# Patient Record
Sex: Male | Born: 1955 | Race: White | Hispanic: No | Marital: Married | Smoking: Never smoker
Health system: Southern US, Community
[De-identification: ages and names within clinical notes are randomized; demographics above are authoritative.]

## PROBLEM LIST (undated history)

## (undated) DIAGNOSIS — I509 Heart failure, unspecified: Secondary | ICD-10-CM

## (undated) HISTORY — PX: COLONOSCOPY: SHX174

## (undated) HISTORY — PX: SKIN TAG REMOVAL: SHX780

---

## 2014-03-16 DIAGNOSIS — IMO0002 Reserved for concepts with insufficient information to code with codable children: Secondary | ICD-10-CM | POA: Insufficient documentation

## 2014-03-16 DIAGNOSIS — R937 Abnormal findings on diagnostic imaging of other parts of musculoskeletal system: Secondary | ICD-10-CM | POA: Insufficient documentation

## 2014-04-09 DIAGNOSIS — S93609A Unspecified sprain of unspecified foot, initial encounter: Secondary | ICD-10-CM | POA: Insufficient documentation

## 2014-12-27 ENCOUNTER — Ambulatory Visit
Admission: EM | Admit: 2014-12-27 | Discharge: 2014-12-27 | Disposition: A | Discharge status: Home / Self Care | Payer: BLUE CROSS/BLUE SHIELD | Attending: Family Medicine | Admitting: Family Medicine

## 2014-12-27 ENCOUNTER — Encounter: Payer: Self-pay | Admitting: Emergency Medicine

## 2014-12-27 ENCOUNTER — Ambulatory Visit: Payer: BLUE CROSS/BLUE SHIELD

## 2014-12-27 DIAGNOSIS — R0989 Other specified symptoms and signs involving the circulatory and respiratory systems: Secondary | ICD-10-CM | POA: Diagnosis present

## 2014-12-27 DIAGNOSIS — R062 Wheezing: Secondary | ICD-10-CM | POA: Insufficient documentation

## 2014-12-27 DIAGNOSIS — R05 Cough: Secondary | ICD-10-CM | POA: Diagnosis not present

## 2014-12-27 HISTORY — DX: Heart failure, unspecified: I50.9

## 2014-12-27 LAB — CBC WITH DIFFERENTIAL/PLATELET
Basophils Absolute: 0.1 10*3/uL (ref 0–0.1)
Basophils Relative: 1 %
EOS PCT: 3 %
Eosinophils Absolute: 0.3 10*3/uL (ref 0–0.7)
HEMATOCRIT: 42.6 % (ref 40.0–52.0)
HEMOGLOBIN: 14.7 g/dL (ref 13.0–18.0)
LYMPHS ABS: 2.1 10*3/uL (ref 1.0–3.6)
LYMPHS PCT: 25 %
MCH: 31.3 pg (ref 26.0–34.0)
MCHC: 34.5 g/dL (ref 32.0–36.0)
MCV: 90.9 fL (ref 80.0–100.0)
Monocytes Absolute: 0.6 10*3/uL (ref 0.2–1.0)
Monocytes Relative: 7 %
Neutro Abs: 5.4 10*3/uL (ref 1.4–6.5)
Neutrophils Relative %: 64 %
PLATELETS: 189 10*3/uL (ref 150–440)
RBC: 4.68 MIL/uL (ref 4.40–5.90)
RDW: 13.2 % (ref 11.5–14.5)
WBC: 8.4 10*3/uL (ref 3.8–10.6)

## 2014-12-27 LAB — BASIC METABOLIC PANEL
ANION GAP: 5 (ref 5–15)
BUN: 20 mg/dL (ref 6–20)
CO2: 32 mmol/L (ref 22–32)
CREATININE: 0.65 mg/dL (ref 0.61–1.24)
Calcium: 9.4 mg/dL (ref 8.9–10.3)
Chloride: 102 mmol/L (ref 101–111)
GFR calc non Af Amer: >60 mL/min (ref >60)
Glucose, Bld: 110 mg/dL — ABNORMAL HIGH (ref 65–99)
POTASSIUM: 3.9 mmol/L (ref 3.5–5.1)
Sodium: 139 mmol/L (ref 135–145)

## 2014-12-27 LAB — BRAIN NATRIURETIC PEPTIDE: B Natriuretic Peptide: 15 pg/mL (ref 0.0–100.0)

## 2014-12-27 MED ORDER — ALBUTEROL SULFATE HFA 108 (90 BASE) MCG/ACT IN AERS: 2.0000 | INHALATION_SPRAY | Freq: Four times a day (QID) | RESPIRATORY_TRACT | Status: DC | PRN

## 2014-12-27 NOTE — ED Provider Notes (Signed)
Patient presents today with history of chest congestion, wheezing. Patient states that he chronically lives with the symptoms but recently has gotten worse. He has used other people's albuterol inhaler in the past with good relief. He states that at one time he was diagnosed with congestive heart failure and then later told that he did not have it. He states that he has had to sleep on an incline recently to help with his breathing. 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 Patient denies any chest pain or increased shortness of breath in the office today. He states that he has been coughing up some white phlegm. He denies any fever, weight loss, headache, nausea, vomiting, dizziness. Patient states that he has had a full cardiac workup done last year with no significant findings. He denies any history of smoking or excessive alcohol use. He does not take any medication on a daily basis.   Review of systems negative except mentioned above. Vitals as per chart. General: NAD, obese HEENT: no pharyngeal erythema, no exudate, no erythema of TMs, no cervical LAD Resp: slightly decreased breath sounds at bases, mild expiratory wheezing, no accessory muscle use, speaks in sentences without problems  Cards: RRR Extremities:1+edema bilateral LEs Neuro: CN II-XII grossly intact   A/P: Wheezing, Cough-chest x-ray shows no acute findings, given his symptoms some lab work was ordered including a BNP, encourage patient to follow up with a primary care provider in the next few days for further workup and treatment, patient Mathies need to follow-up with a pulmonologist and/or cardiologist, I have asked the patient to obtain records from [place] regarding his cardiac workup done last year, prior to his visit with the primary care provider. Albuterol MDI was prescribed given symptomatic relief in the  past. I have asked the patient to seek immediate medical attention if his symptoms change or worsen. Patient appreciative.  Paulina Fusi, MD 12/27/14 1630

## 2014-12-27 NOTE — Discharge Instructions (Signed)
As inhaler has worked in the past, try this. If symptoms worsen or change as discussed seek immediate medical attention.

## 2014-12-27 NOTE — ED Notes (Signed)
Patient states he is wheezing and feels like he can't get a deep breath

## 2014-12-27 NOTE — ED Notes (Addendum)
Scheduled an appointment for patient upstairs Tomorrow June 16 at 2pm with Dr. Vicente Masson for followup on labs.

## 2014-12-28 ENCOUNTER — Ambulatory Visit (INDEPENDENT_AMBULATORY_CARE_PROVIDER_SITE_OTHER): Payer: BLUE CROSS/BLUE SHIELD | Admitting: Family Medicine

## 2014-12-28 ENCOUNTER — Encounter: Payer: Self-pay | Admitting: Family Medicine

## 2014-12-28 VITALS — BP 130/98 | HR 100 | Ht 70.0 in | Wt 323.4 lb

## 2014-12-28 DIAGNOSIS — R5382 Chronic fatigue, unspecified: Secondary | ICD-10-CM | POA: Diagnosis not present

## 2014-12-28 DIAGNOSIS — Z8719 Personal history of other diseases of the digestive system: Secondary | ICD-10-CM | POA: Diagnosis not present

## 2014-12-28 DIAGNOSIS — G4733 Obstructive sleep apnea (adult) (pediatric): Secondary | ICD-10-CM | POA: Diagnosis not present

## 2014-12-28 DIAGNOSIS — R739 Hyperglycemia, unspecified: Secondary | ICD-10-CM

## 2014-12-28 DIAGNOSIS — J9801 Acute bronchospasm: Secondary | ICD-10-CM | POA: Insufficient documentation

## 2014-12-28 DIAGNOSIS — G4730 Sleep apnea, unspecified: Secondary | ICD-10-CM

## 2014-12-28 DIAGNOSIS — E669 Obesity, unspecified: Secondary | ICD-10-CM
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
## 2014-12-29 LAB — HEMOGLOBIN A1C
Est. average glucose Bld gHb Est-mCnc: 105 mg/dL
Hgb A1c MFr Bld: 5.3 % (ref 4.8–5.6)

## 2014-12-29 LAB — TSH: TSH: 3.86 u[IU]/mL (ref 0.450–4.500)

## 2014-12-29 LAB — VITAMIN D 25 HYDROXY (VIT D DEFICIENCY, FRACTURES): Vit D, 25-Hydroxy: 25.8 ng/mL — ABNORMAL LOW (ref 30.0–100.0)

## 2014-12-29 LAB — VITAMIN B12: VITAMIN B 12: 551 pg/mL (ref 211–946)

## 2015-01-05 ENCOUNTER — Other Ambulatory Visit: Payer: Self-pay

## 2015-01-05 ENCOUNTER — Telehealth: Payer: Self-pay
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
## 2015-01-25 ENCOUNTER — Ambulatory Visit: Payer: BLUE CROSS/BLUE SHIELD | Admitting: Family Medicine

## 2015-03-06 ENCOUNTER — Ambulatory Visit: Admit: 2015-03-06 | Payer: BLUE CROSS/BLUE SHIELD | Admitting: Gastroenterology

## 2015-03-06 SURGERY — COLONOSCOPY WITH PROPOFOL
Anesthesia: General

## 2015-05-10 DIAGNOSIS — S0990XA Unspecified injury of head, initial encounter: Secondary | ICD-10-CM | POA: Insufficient documentation

## 2015-05-31 ENCOUNTER — Ambulatory Visit (INDEPENDENT_AMBULATORY_CARE_PROVIDER_SITE_OTHER): Payer: BLUE CROSS/BLUE SHIELD

## 2015-05-31 ENCOUNTER — Ambulatory Visit
Admission: EM | Admit: 2015-05-31 | Discharge: 2015-05-31 | Disposition: A | Discharge status: Home / Self Care | Payer: BLUE CROSS/BLUE SHIELD | Attending: Family Medicine | Admitting: Family Medicine

## 2015-05-31 ENCOUNTER — Encounter: Payer: Self-pay | Admitting: *Deleted

## 2015-05-31 DIAGNOSIS — W19XXXA Unspecified fall, initial encounter: Secondary | ICD-10-CM | POA: Diagnosis not present

## 2015-05-31 DIAGNOSIS — M25561 Pain in right knee: Secondary | ICD-10-CM | POA: Diagnosis not present

## 2015-05-31 NOTE — ED Notes (Signed)
Pt states that he lost his balance and landed on his right knee.

## 2015-05-31 NOTE — Discharge Instructions (Signed)
Use anti-inflammatories (ibuprofen or aleve) as needed.  Rest, Ice, Elevation, Compression.  If you fail to improve you will need ortho referral.  Take care  Dr. Lacinda Axon

## 2015-05-31 NOTE — ED Provider Notes (Signed)
CSN: [id number]     Arrival date & time 05/31/15  1742 History   First MD Initiated Contact with Patient 05/31/15 1801     Chief Complaint  Patient presents with  . Knee Injury   (Consider location/radiation/quality/duration/timing/severity/associated sxs/prior Treatment) HPI 59 year old male presents with complaints of right knee pain after suffering a fall earlier today.  Patient reports that he was looking down and approached uneven ground and had a misstep. He subsequently went to the knee and injured his right knee on "rock hard ground". He states that this happened approximately 4 hours ago. He reports immediate pain and swelling as well as warmth and mild redness. He reports decreased range of motion. Pain is currently 7 out of 10 in severity. He has not taken any medications or tried any interventions. He states that this feels similar to when he fractured his left knee years ago.  Past Medical History  Diagnosis Date  . CHF (congestive heart failure) ([place])    History reviewed. No pertinent past surgical history.   Family History  Problem Relation Age of Onset  . Diabetes Father   . Hearing loss Father   . Heart disease Mother    Social History  Substance Use Topics  . Smoking status: Never Smoker   . Smokeless tobacco: Never Used  . Alcohol Use: 1.8 oz/week    0 Standard drinks or equivalent, 1 Glasses of wine, 1 Cans of beer, 1 Shots of liquor per week     Comment: occasionaly    Review of Systems  Constitutional: Negative.   Respiratory: Negative.   Cardiovascular: Negative.   Musculoskeletal:       Right knee pain.   Allergies  Review of patient's allergies indicates no known allergies.  Home Medications   Prior to Admission medications   Medication Sig Start Date End Date Taking? Authorizing Provider  albuterol (PROVENTIL HFA;VENTOLIN HFA) 108 (90 BASE) MCG/ACT inhaler Inhale 2 puffs into the lungs every 6 (six) hours as needed for wheezing or shortness  of breath. 12/27/14   Paulina Fusi, MD  Multiple Vitamin (MULTI-VITAMINS) TABS Take 1 tablet by mouth.    Historical Provider, MD  Omega-3 Fatty Acids (FISH OIL) 1000 MG CAPS Take 1 capsule by mouth.    Historical Provider, MD   Meds Ordered and Administered this Visit  Medications - No data to display  BP 120/81 mmHg  Pulse 88  Temp(Src) 98.7 F (37.1 C) (Tympanic)  Ht 5\' 10"  (1.778 m)  Wt 335 lb (151.955 kg)  BMI 48.07 kg/m2  SpO2 98% No data found.  Physical Exam  Constitutional: He appears well-developed. No distress.  Cardiovascular: Normal rate and regular rhythm.   Pulmonary/Chest: Effort normal.  Coarse breath sounds throughout.  Musculoskeletal:  Right knee -  Inspection - mild anterior erythema/warmth. Palpation - mild lateral joint line tenderness. Patient also has tenderness over the patella. ROM decrease in extension. Ligaments with solid consistent endpoints including ACL, PCL, LCL, MCL.    Neurological: He is alert.  Psychiatric: He has a normal mood and affect.  Vitals reviewed.   ED Course  Procedures (including critical care time)  Labs Review Labs Reviewed - No data to display  Imaging Review Dg Knee Complete 4 Views Right  05/31/2015  CLINICAL DATA:  Fall today with anterior right knee pain, initial encounter EXAM: RIGHT KNEE - COMPLETE 4+ VIEW COMPARISON:  None. FINDINGS: There is no evidence of fracture, dislocation, or joint effusion. There is no evidence of arthropathy or  other focal bone abnormality. Soft tissues are unremarkable. IMPRESSION: No acute abnormality noted. Electronically Signed   By: Inez Catalina M.D.   On: 05/31/2015 18:40    MDM   1. Right knee pain   2. Fall, initial encounter    59 year old obese male presents following a fall. Patient complaint of right knee pain. Exam remarkable for lateral joint line tenderness. X-ray reviewed personally and I agree with the radiology read. No acute abnormality. Could be a meniscal  injury. Treating conservatively with RICE and anti-inflammatories. I encouraged him to follow-up with his PCP if he fails to improve or worsens as he [place] need MRI/orthopedic eval.   Coral Spikes, DO 05/31/15 1856

## 2015-06-20 ENCOUNTER — Encounter: Payer: Self-pay | Admitting: Family Medicine

## 2015-06-20 ENCOUNTER — Ambulatory Visit (INDEPENDENT_AMBULATORY_CARE_PROVIDER_SITE_OTHER): Payer: BLUE CROSS/BLUE SHIELD | Admitting: Family Medicine

## 2015-06-20 VITALS — BP 138/82 | HR 68 | Ht 70.0 in | Wt 339.0 lb

## 2015-06-20 DIAGNOSIS — N529 Male erectile dysfunction, unspecified: Secondary | ICD-10-CM | POA: Diagnosis not present

## 2015-06-20 DIAGNOSIS — G4733 Obstructive sleep apnea (adult) (pediatric): Secondary | ICD-10-CM

## 2015-06-20 DIAGNOSIS — J9801 Acute bronchospasm: Secondary | ICD-10-CM

## 2015-06-20 DIAGNOSIS — R5382 Chronic fatigue, unspecified: Secondary | ICD-10-CM | POA: Diagnosis not present

## 2015-06-20 DIAGNOSIS — E559 Vitamin D deficiency, unspecified: Secondary | ICD-10-CM

## 2015-06-20 DIAGNOSIS — D239 Other benign neoplasm of skin, unspecified: Secondary | ICD-10-CM

## 2015-06-20 DIAGNOSIS — N471 Phimosis: Secondary | ICD-10-CM | POA: Diagnosis not present

## 2015-06-20 DIAGNOSIS — G4730 Sleep apnea, unspecified: Secondary | ICD-10-CM

## 2015-06-20 DIAGNOSIS — Z23 Encounter for immunization: Secondary | ICD-10-CM

## 2015-06-20 DIAGNOSIS — E291 Testicular hypofunction: Secondary | ICD-10-CM | POA: Diagnosis not present

## 2015-06-20 DIAGNOSIS — D229 Melanocytic nevi, unspecified: Secondary | ICD-10-CM | POA: Insufficient documentation

## 2015-06-20 DIAGNOSIS — E349 Endocrine disorder, unspecified: Secondary | ICD-10-CM

## 2015-06-20 MED ORDER — BETAMETHASONE DIPROPIONATE AUG 0.05 % EX CREA: TOPICAL_CREAM | Freq: Two times a day (BID) | CUTANEOUS | Status: DC

## 2015-06-20 MED ORDER — ALBUTEROL SULFATE HFA 108 (90 BASE) MCG/ACT IN AERS
2.0000 | INHALATION_SPRAY | Freq: Four times a day (QID) | RESPIRATORY_TRACT | Status: AC | PRN
Start: 2015-06-20 — End: ?
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
## 2015-06-21 DIAGNOSIS — E349 Endocrine disorder, unspecified: Secondary | ICD-10-CM | POA: Insufficient documentation

## 2015-06-21 LAB — VITAMIN D 25 HYDROXY (VIT D DEFICIENCY, FRACTURES): Vit D, 25-Hydroxy: 24.2 ng/mL — ABNORMAL LOW (ref 30.0–100.0)

## 2015-06-22 LAB — TESTOSTERONE,FREE AND TOTAL: Testosterone, Free: 5.2 pg/mL — ABNORMAL LOW (ref 7.2–24.0)

## 2015-06-27 ENCOUNTER — Ambulatory Visit (INDEPENDENT_AMBULATORY_CARE_PROVIDER_SITE_OTHER): Payer: BLUE CROSS/BLUE SHIELD | Admitting: Family Medicine

## 2015-06-27 ENCOUNTER — Encounter: Payer: Self-pay | Admitting: Family Medicine

## 2015-06-27 VITALS — BP 124/80 | HR 64 | Ht 70.0 in | Wt 335.0 lb

## 2015-06-27 DIAGNOSIS — E291 Testicular hypofunction: Secondary | ICD-10-CM | POA: Diagnosis not present

## 2015-06-27 DIAGNOSIS — N471 Phimosis: Secondary | ICD-10-CM

## 2015-06-27 DIAGNOSIS — E559 Vitamin D deficiency, unspecified: Secondary | ICD-10-CM | POA: Diagnosis not present

## 2015-06-27 DIAGNOSIS — G4733 Obstructive sleep apnea (adult) (pediatric): Secondary | ICD-10-CM

## 2015-06-27 DIAGNOSIS — N529 Male erectile dysfunction, unspecified: Secondary | ICD-10-CM | POA: Diagnosis not present

## 2015-06-27 DIAGNOSIS — E349 Endocrine disorder, unspecified: Secondary | ICD-10-CM

## 2015-06-27 DIAGNOSIS — G4730 Sleep apnea, unspecified: Secondary | ICD-10-CM
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
## 2015-07-19 ENCOUNTER — Ambulatory Visit: Payer: BLUE CROSS/BLUE SHIELD | Attending: Specialist

## 2015-07-19 DIAGNOSIS — I509 Heart failure, unspecified: Secondary | ICD-10-CM | POA: Insufficient documentation

## 2015-07-19 DIAGNOSIS — Z77090 Contact with and (suspected) exposure to asbestos: Secondary | ICD-10-CM | POA: Diagnosis not present

## 2015-07-19 DIAGNOSIS — E669 Obesity, unspecified: Secondary | ICD-10-CM | POA: Diagnosis present

## 2015-07-19 DIAGNOSIS — R0683 Snoring: Secondary | ICD-10-CM | POA: Diagnosis present

## 2015-07-19 DIAGNOSIS — R05 Cough: Secondary | ICD-10-CM | POA: Insufficient documentation

## 2015-07-19 DIAGNOSIS — F1010 Alcohol abuse, uncomplicated: Secondary | ICD-10-CM | POA: Diagnosis not present

## 2015-07-19 DIAGNOSIS — G4730 Sleep apnea, unspecified: Secondary | ICD-10-CM | POA: Insufficient documentation

## 2015-07-26 ENCOUNTER — Encounter: Payer: Self-pay | Admitting: Family Medicine

## 2015-07-26 ENCOUNTER — Ambulatory Visit (INDEPENDENT_AMBULATORY_CARE_PROVIDER_SITE_OTHER): Payer: BLUE CROSS/BLUE SHIELD | Admitting: Family Medicine

## 2015-07-26 VITALS — BP 136/84 | HR 71 | Temp 98.3°F | Resp 16 | Ht 70.0 in | Wt 347.0 lb

## 2015-07-26 DIAGNOSIS — B029 Zoster without complications: Secondary | ICD-10-CM

## 2015-07-26 DIAGNOSIS — E559 Vitamin D deficiency, unspecified: Secondary | ICD-10-CM

## 2015-07-26 DIAGNOSIS — E291 Testicular hypofunction: Secondary | ICD-10-CM

## 2015-07-26 DIAGNOSIS — G4733 Obstructive sleep apnea (adult) (pediatric): Secondary | ICD-10-CM | POA: Diagnosis not present

## 2015-07-26 DIAGNOSIS — G4730 Sleep apnea, unspecified: Secondary | ICD-10-CM

## 2015-07-26 DIAGNOSIS — E349 Endocrine disorder, unspecified: Secondary | ICD-10-CM

## 2015-07-26 MED ORDER — FLUOCINONIDE 0.05 % EX CREA: 1.0000 "application " | TOPICAL_CREAM | Freq: Two times a day (BID) | CUTANEOUS | Status: DC

## 2015-07-26 MED ORDER — VALACYCLOVIR HCL 1 G PO TABS: 1000.0000 mg | ORAL_TABLET | Freq: Three times a day (TID) | ORAL | Status: DC
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
## 2015-07-26 NOTE — Patient Instructions (Signed)

## 2015-10-04 ENCOUNTER — Encounter: Payer: Self-pay | Admitting: Family Medicine

## 2015-10-04 ENCOUNTER — Ambulatory Visit (INDEPENDENT_AMBULATORY_CARE_PROVIDER_SITE_OTHER): Payer: BLUE CROSS/BLUE SHIELD | Admitting: Family Medicine

## 2015-10-04 VITALS — BP 119/77 | HR 84 | Ht 70.0 in | Wt 349.0 lb

## 2015-10-04 DIAGNOSIS — E291 Testicular hypofunction: Secondary | ICD-10-CM

## 2015-10-04 DIAGNOSIS — N471 Phimosis: Secondary | ICD-10-CM | POA: Diagnosis not present

## 2015-10-04 DIAGNOSIS — E559 Vitamin D deficiency, unspecified: Secondary | ICD-10-CM

## 2015-10-04 DIAGNOSIS — G4733 Obstructive sleep apnea (adult) (pediatric): Secondary | ICD-10-CM | POA: Diagnosis not present

## 2015-10-04 DIAGNOSIS — G4730 Sleep apnea, unspecified: Secondary | ICD-10-CM

## 2015-10-04 DIAGNOSIS — E349 Endocrine disorder, unspecified: Secondary | ICD-10-CM
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
## 2015-10-05 LAB — VITAMIN D 25 HYDROXY (VIT D DEFICIENCY, FRACTURES): VIT D 25 HYDROXY: 28.7 ng/mL — AB (ref 30.0–100.0)

## 2015-10-05 LAB — TESTOSTERONE: Testosterone: 263 ng/dL — ABNORMAL LOW (ref 348–1197)

## 2015-10-12 ENCOUNTER — Ambulatory Visit (INDEPENDENT_AMBULATORY_CARE_PROVIDER_SITE_OTHER): Payer: BLUE CROSS/BLUE SHIELD | Admitting: Family Medicine

## 2015-10-12 ENCOUNTER — Encounter: Payer: Self-pay | Admitting: Family Medicine

## 2015-10-12 VITALS — BP 122/80 | HR 80 | Ht 70.0 in | Wt 348.0 lb

## 2015-10-12 DIAGNOSIS — N471 Phimosis: Secondary | ICD-10-CM

## 2015-10-12 DIAGNOSIS — N529 Male erectile dysfunction, unspecified: Secondary | ICD-10-CM

## 2015-10-12 DIAGNOSIS — E291 Testicular hypofunction: Secondary | ICD-10-CM

## 2015-10-12 DIAGNOSIS — G4733 Obstructive sleep apnea (adult) (pediatric): Secondary | ICD-10-CM

## 2015-10-12 DIAGNOSIS — E559 Vitamin D deficiency, unspecified: Secondary | ICD-10-CM

## 2015-10-12 DIAGNOSIS — E349 Endocrine disorder, unspecified: Secondary | ICD-10-CM

## 2015-10-12 DIAGNOSIS — G4730 Sleep apnea, unspecified: Secondary | ICD-10-CM

## 2015-10-12 MED ORDER — BETAMETHASONE DIPROPIONATE AUG 0.05 % EX CREA: TOPICAL_CREAM | Freq: Two times a day (BID) | CUTANEOUS | Status: AC | PRN

## 2015-10-12 MED ORDER — "SYRINGE/NEEDLE (DISP) 21G X 1-1/2"" 5 ML MISC": 1.0000 | Status: DC

## 2015-10-12 MED ORDER — TESTOSTERONE CYPIONATE 200 MG/ML IM SOLN: 200.0000 mg | INTRAMUSCULAR | Status: DC

## 2015-10-16 ENCOUNTER — Ambulatory Visit
Admission: EM | Admit: 2015-10-16 | Discharge: 2015-10-16 | Disposition: A | Discharge status: Home / Self Care | Payer: BLUE CROSS/BLUE SHIELD | Attending: Family Medicine | Admitting: Family Medicine

## 2015-10-16 ENCOUNTER — Encounter: Payer: Self-pay | Admitting: Emergency Medicine

## 2015-10-16 DIAGNOSIS — T8090XA Unspecified complication following infusion and therapeutic injection, initial encounter: Secondary | ICD-10-CM

## 2015-10-16 MED ORDER — CHOLECALCIFEROL 125 MCG (5000 UT) PO TABS: 5000.0000 [IU] | ORAL_TABLET | Freq: Every day | ORAL | Status: AC
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
## 2015-10-16 NOTE — ED Provider Notes (Signed)
CSN: [id number]     Arrival date & time 10/16/15  1013 History   First MD Initiated Contact with Patient 10/16/15 1131    Nurses notes were reviewed. Chief Complaint  Patient presents with  . Foreign Body in Skin   Complaining of leaving and needle in his leg or thigh after his first testosterone injection. States was a 2 inch needle test now trapped in his thigh.   (Consider location/radiation/quality/duration/timing/severity/associated sxs/prior Treatment) The history is provided by the patient. No language interpreter was used.    Past Medical History  Diagnosis Date  . CHF (congestive heart failure) [facility])    Past Surgical History  Procedure Laterality Date  . Skin tag removal      benign  . Colonoscopy      last 1 was 60 yrs old   Family History  Problem Relation Age of Onset  . Diabetes Father   . Hearing loss Father   . Heart disease Mother    Social History  Substance Use Topics  . Smoking status: Never Smoker   . Smokeless tobacco: Never Used  . Alcohol Use: 1.8 oz/week    0 Standard drinks or equivalent, 1 Glasses of wine, 1 Cans of beer, 1 Shots of liquor per week     Comment: occasionaly    Review of Systems  All other systems reviewed and are negative.   Allergies  Review of patient's allergies indicates no known allergies.  Home Medications   Prior to Admission medications   Medication Sig Start Date End Date Taking? Authorizing Provider  albuterol (PROVENTIL HFA;VENTOLIN HFA) 108 (90 BASE) MCG/ACT inhaler Inhale 2 puffs into the lungs every 6 (six) hours as needed for wheezing or shortness of breath. 06/20/15   Adline Potter, MD  augmented betamethasone dipropionate (DIPROLENE-AF) 0.05 % cream Apply topically 2 (two) times daily as needed. 10/12/15   Adline Potter, MD  cholecalciferol (VITAMIN D) 1000 UNITS tablet Take 2,000 Units by mouth daily.    Historical Provider, MD  diphenhydrAMINE (BENADRYL) 25 MG tablet Take 25 mg by mouth as needed.     Historical Provider, MD  Multiple Vitamin (MULTI-VITAMINS) TABS Take 1 tablet by mouth.    Historical Provider, MD  Omega-3 Fatty Acids (FISH OIL) 1000 MG CAPS Take 1 capsule by mouth.    Historical Provider, MD  SYRINGE-NEEDLE, DISP, 5 ML (SAFETY SYRINGES/NEEDLE) 21G X 1-1/2" 5 ML MISC 1 Syringe by Does not apply route every 14 (fourteen) days. 10/12/15   Adline Potter, MD  testosterone cypionate (DEPOTESTOSTERONE CYPIONATE) 200 MG/ML injection Inject 1 mL (200 mg total) into the muscle every 14 (fourteen) days. 10/12/15   Adline Potter, MD   Meds Ordered and Administered this Visit  Medications - No data to display  BP 151/80 mmHg  Pulse 69  Temp(Src) 98.3 F (36.8 C) (Oral)  Resp 18  SpO2 97% No data found.   Physical Exam  Constitutional: He appears well-developed and well-nourished. He appears distressed.  Psychiatric: His mood appears anxious.  Vitals reviewed.  nurse reports very anxious very distressed white male who came in because he had a needle his thigh after he gave himself a testosterone injection. The nurse came back to tell us that he was very upset and very concerned. However once she went to examine the needle she found out it was an automatic retracting needle/syringe to prevent accidental needle sticks. She still saw no signs of obvious needle sticking out of the site. And when she examined this red  and she can hear the needle in the syringe. Nurse explained this to the patient returned with provider would be and shortly to see him. But when we went into the room patient had left apparently satisfied with the findings that the needle was in the syringe  ED Course  Procedures (including critical care time)  Labs Review Labs Reviewed - No data to display  Imaging Review No results found.   Visual Acuity Review  Right Eye Distance:   Left Eye Distance:   Bilateral Distance:    Right Eye Near:   Left Eye Near:    Bilateral Near:         MDM   1.  Complication of injection, initial encounter    Patient left without being seen by provider but was reassured that the needle tumors was the thought was stuck in his thigh was in the syringe. He states that the pharmacist didn't tell me anything different nor were relative called about the missing needle. According to the nurse.    Frederich Cha, MD 10/16/15 [phone]

## 2015-10-16 NOTE — ED Notes (Signed)
After inspecting the syringe brought in by patient, the needle was found to be inside the syringe. Pt did not know that the syringe was a retractable safety needle. Providers aware.

## 2015-10-16 NOTE — ED Notes (Signed)
Pt presents with possible needle stuck in right thigh after giving himself an injection this am.

## 2016-01-14 ENCOUNTER — Ambulatory Visit: Payer: BLUE CROSS/BLUE SHIELD | Admitting: Family Medicine

## 2016-01-21 ENCOUNTER — Encounter: Payer: Self-pay | Admitting: Internal Medicine

## 2016-01-21 ENCOUNTER — Ambulatory Visit (INDEPENDENT_AMBULATORY_CARE_PROVIDER_SITE_OTHER): Payer: BLUE CROSS/BLUE SHIELD | Admitting: Internal Medicine

## 2016-01-21 VITALS — BP 112/82 | HR 72 | Resp 16 | Ht 70.0 in | Wt 358.0 lb

## 2016-01-21 DIAGNOSIS — E291 Testicular hypofunction: Secondary | ICD-10-CM

## 2016-01-21 DIAGNOSIS — N471 Phimosis: Secondary | ICD-10-CM

## 2016-01-21 DIAGNOSIS — N529 Male erectile dysfunction, unspecified: Secondary | ICD-10-CM | POA: Diagnosis not present

## 2016-01-21 DIAGNOSIS — G4730 Sleep apnea, unspecified: Secondary | ICD-10-CM

## 2016-01-21 DIAGNOSIS — E349 Endocrine disorder, unspecified: Secondary | ICD-10-CM

## 2016-01-21 DIAGNOSIS — G4733 Obstructive sleep apnea (adult) (pediatric): Secondary | ICD-10-CM

## 2016-01-21 MED ORDER — TESTOSTERONE 50 MG/5GM (1%) TD GEL: 5.0000 g | Freq: Every day | TRANSDERMAL | Status: AC
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
## 2016-02-04 ENCOUNTER — Ambulatory Visit: Payer: BLUE CROSS/BLUE SHIELD | Admitting: Urology

## 2016-02-04 ENCOUNTER — Encounter: Payer: Self-pay | Admitting: Urology

## 2016-02-04 NOTE — Progress Notes (Deleted)
02/04/2016 3:33 PM   Sean Perry Aug 16, 1955 [id number]  Referring provider: Glean Hess, MD [address] [place], [place] [address]  No chief complaint on file.   HPI: Patient is 60 year old Caucasian male who is referred by his PCP, Dr. Army Melia, for phimosis.      PMH: Past Medical History:  Diagnosis Date  . CHF (congestive heart failure) [facility])     Surgical History: Past Surgical History:  Procedure Laterality Date  . COLONOSCOPY     last 1 was 60 yrs old  . SKIN TAG REMOVAL     benign    Home Medications:    Medication List       Accurate as of 02/04/16  3:33 PM. Always use your most recent med list.          albuterol 108 (90 Base) MCG/ACT inhaler Commonly known as:  PROVENTIL HFA;VENTOLIN HFA Inhale 2 puffs into the lungs every 6 (six) hours as needed for wheezing or shortness of breath.   augmented betamethasone dipropionate 0.05 % cream Commonly known as:  DIPROLENE-AF Apply topically 2 (two) times daily as needed.   Cholecalciferol 5000 units Tabs Take 1 tablet (5,000 Units total) by mouth daily.   diphenhydrAMINE 25 MG tablet Commonly known as:  BENADRYL Take 25 mg by mouth as needed.   Fish Oil 1000 MG Caps Take 1 capsule by mouth.   MULTI-VITAMINS Tabs Take 1 tablet by mouth.   testosterone 50 MG/5GM (1%) Gel Commonly known as:  ANDROGEL Place 5 g onto the skin daily.       Allergies:  Allergies  Allergen Reactions  . Viagra [Sildenafil] Other (See Comments)    Rectal spasm and red eyes    Family History: Family History  Problem Relation Age of Onset  . Diabetes Father   . Hearing loss Father   . Heart disease Mother     Social History:  reports that he has never smoked. He has never used smokeless tobacco. He reports that he drinks about 1.8 oz of alcohol per week . He reports that he does not use drugs.  ROS:                                        Physical Exam: There were  no vitals taken for this visit.  Constitutional: Well nourished. Alert and oriented, No acute distress. HEENT: [place] AT, moist mucus membranes. Trachea midline, no masses. Cardiovascular: No clubbing, cyanosis, or edema. Respiratory: Normal respiratory effort, no increased work of breathing. GI: Abdomen is soft, non tender, non distended, no abdominal masses. Liver and spleen not palpable.  No hernias appreciated.  Stool sample for occult testing is not indicated.   GU: No CVA tenderness.  No bladder fullness or masses.  Patient with circumcised/uncircumcised phallus. ***Foreskin easily retracted***  Urethral meatus is patent.  No penile discharge. No penile lesions or rashes. Scrotum without lesions, cysts, rashes and/or edema.  Testicles are located scrotally bilaterally. No masses are appreciated in the testicles. Left and right epididymis are normal. Rectal: Patient with  normal sphincter tone. Anus and perineum without scarring or rashes. No rectal masses are appreciated. Prostate is approximately *** grams, *** nodules are appreciated. Seminal vesicles are normal. Skin: No rashes, bruises or suspicious lesions. Lymph: No cervical or inguinal adenopathy. Neurologic: Grossly intact, no focal deficits, moving all 4 extremities. Psychiatric:  Normal mood and affect.  Laboratory Data: Lab Results  Component Value Date   WBC 8.4 12/27/2014   HGB 14.7 12/27/2014   HCT 42.6 12/27/2014   MCV 90.9 12/27/2014   PLT 189 12/27/2014    Lab Results  Component Value Date   CREATININE 0.65 12/27/2014    No results found for: PSA  Lab Results  Component Value Date   TESTOSTERONE 263 (L) 10/04/2015    Lab Results  Component Value Date   HGBA1C 5.3 12/28/2014    Lab Results  Component Value Date   TSH 3.860 12/28/2014      Urinalysis No results found for: COLORURINE, APPEARANCEUR, LABSPEC, PHURINE, GLUCOSEU, HGBUR, BILIRUBINUR, KETONESUR, PROTEINUR, UROBILINOGEN, NITRITE,  LEUKOCYTESUR  Pertinent Imaging: ***  Assessment & Plan:  ***  1 Phimosis:  No Follow-up on file.  These notes generated with voice recognition software. I apologize for typographical errors.  Zara Council, [facility] [address] [place], [place] [address] [phone]

## 2016-02-14 ENCOUNTER — Other Ambulatory Visit: Payer: Self-pay

## 2016-02-14 DIAGNOSIS — E349 Endocrine disorder, unspecified: Secondary | ICD-10-CM

## 2016-02-18 NOTE — Progress Notes (Deleted)
02/19/2016 9:11 PM   Sean Perry 1955/09/03 [id number]  Referring provider: Glean Hess, MD [address] [place], [place] [address]  No chief complaint on file.   HPI: Patient is a 60 year old Caucasian male who is referred by his PCP, Dr. Army Melia, for phimosis and erectile dysfunction.    BPH WITH LUTS His IPSS score today is ***, which is *** lower urinary tract symptomatology. He is *** with his quality life due to his urinary symptoms. His PVR is *** mL.  His previous IPSS score was ***.  His previous PVR is *** mL.    His major complaint today ***.  He has had these symptoms for *** years.  He denies any dysuria, hematuria or suprapubic pain.   He currently taking ***.  His has had ***.  Previous PSA's:     He also denies any recent fevers, chills, nausea or vomiting.  He has a family history of PCa, with ***.   He does not have a family history of PCa.***    Score:  1-7 Mild 8-19 Moderate 20-35 Severe  Erectile dysfunction His SHIM score is ***, which is ***.   His previous SHIM score was ***.  He has been having difficulty with erections for ***.   His major complaint is ***.  His libido is ***.   His risk factors for ED are ***.  He denies any painful erections or curvatures with his erections.   He has tried *** in the past.        Score: 1-7 Severe ED 8-11 Moderate ED 12-16 Mild-Moderate ED 17-21 Mild ED 22-25 No ED      PMH: Past Medical History:  Diagnosis Date  . CHF (congestive heart failure) [facility])     Surgical History: Past Surgical History:  Procedure Laterality Date  . COLONOSCOPY     last 1 was 60 yrs old  . SKIN TAG REMOVAL     benign    Home Medications:    Medication List       Accurate as of 02/18/16  9:11 PM. Always use your most recent med list.          albuterol 108 (90 Base) MCG/ACT inhaler Commonly known as:  PROVENTIL HFA;VENTOLIN HFA Inhale 2 puffs into the lungs every 6 (six) hours as  needed for wheezing or shortness of breath.   augmented betamethasone dipropionate 0.05 % cream Commonly known as:  DIPROLENE-AF Apply topically 2 (two) times daily as needed.   Cholecalciferol 5000 units Tabs Take 1 tablet (5,000 Units total) by mouth daily.   diphenhydrAMINE 25 MG tablet Commonly known as:  BENADRYL Take 25 mg by mouth as needed.   Fish Oil 1000 MG Caps Take 1 capsule by mouth.   MULTI-VITAMINS Tabs Take 1 tablet by mouth.   testosterone 50 MG/5GM (1%) Gel Commonly known as:  ANDROGEL Place 5 g onto the skin daily.       Allergies:  Allergies  Allergen Reactions  . Viagra [Sildenafil] Other (See Comments)    Rectal spasm and red eyes    Family History: Family History  Problem Relation Age of Onset  . Diabetes Father   . Hearing loss Father   . Heart disease Mother     Social History:  reports that he has never smoked. He has never used smokeless tobacco. He reports that he drinks about 1.8 oz of alcohol per week . He reports that he does not  use drugs.  ROS:                                        Physical Exam: There were no vitals taken for this visit.  Constitutional: Well nourished. Alert and oriented, No acute distress. HEENT: [place] AT, moist mucus membranes. Trachea midline, no masses. Cardiovascular: No clubbing, cyanosis, or edema. Respiratory: Normal respiratory effort, no increased work of breathing. GI: Abdomen is soft, non tender, non distended, no abdominal masses. Liver and spleen not palpable.  No hernias appreciated.  Stool sample for occult testing is not indicated.   GU: No CVA tenderness.  No bladder fullness or masses.  Patient with circumcised/uncircumcised phallus. ***Foreskin easily retracted***  Urethral meatus is patent.  No penile discharge. No penile lesions or rashes. Scrotum without lesions, cysts, rashes and/or edema.  Testicles are located scrotally bilaterally. No masses are appreciated in  the testicles. Left and right epididymis are normal. Rectal: Patient with  normal sphincter tone. Anus and perineum without scarring or rashes. No rectal masses are appreciated. Prostate is approximately *** grams, *** nodules are appreciated. Seminal vesicles are normal. Skin: No rashes, bruises or suspicious lesions. Lymph: No cervical or inguinal adenopathy. Neurologic: Grossly intact, no focal deficits, moving all 4 extremities. Psychiatric: Normal mood and affect.  Laboratory Data: Lab Results  Component Value Date   WBC 8.4 12/27/2014   HGB 14.7 12/27/2014   HCT 42.6 12/27/2014   MCV 90.9 12/27/2014   PLT 189 12/27/2014    Lab Results  Component Value Date   CREATININE 0.65 12/27/2014    No results found for: PSA  Lab Results  Component Value Date   TESTOSTERONE 263 (L) 10/04/2015    Lab Results  Component Value Date   HGBA1C 5.3 12/28/2014    Lab Results  Component Value Date   TSH 3.860 12/28/2014    No results found for: CHOL, HDL, CHOLHDL, VLDL, LDLCALC  No results found for: AST No results found for: ALT No components found for: ALKALINEPHOPHATASE No components found for: BILIRUBINTOTAL  No results found for: ESTRADIOL   Urinalysis No results found for: COLORURINE, APPEARANCEUR, LABSPEC, PHURINE, GLUCOSEU, HGBUR, BILIRUBINUR, KETONESUR, PROTEINUR, UROBILINOGEN, NITRITE, LEUKOCYTESUR  Pertinent Imaging: ***  Assessment & Plan:  ***  1. Phimosis  - discussed circumcision procedure, risks and recovery  - d/c  2. Erectile dysfunction:  SHIM score is ***.   I explained to the patient that in order to achieve an erection it takes good functioning of the nervous system (parasympathetic, sympathetic, sensory and motor), good blood flow into the erectile tissue of the penis and a desire to have sex.   I stated that conditions like diabetes, hypertension, coronary artery disease, peripheral vascular disease, smoking, alcohol consumption, age and BPH can  diminish the ability to have an erection.   We trying a different PDE5 inhibitor, intra-urethral suppositories, intracavernous vasoactive drug injection therapy, vacuum constriction device and penile prosthesis implantation.  We discussed trying a different PDE5 inhibitor, intra-urethral suppositories, intracavernous vasoactive drug injection therapy, vacuum constriction device and penile prosthesis implantation.   - SHIM score is ***  - Continue Cialis, Viagra, Levitra, Stendra  - RTC in *** months for repeat SHIM score and exam, as testosterone therapy can affect erectile function  3. BPH with LUTS  - IPSS score is ***, it is stable/improving/worsening  - Continue conservative management, avoiding bladder irritants and timed  voiding's  - Initiate alpha-blocker (***), discussed side effects  - Initiate 5 alpha reductase inhibitor (***), discussed side effects  - Continue tamsulosin 0.4 mg daily, alfuzosin 10 mg daily, Rapaflo 8 mg daily, terazosin, doxazosin, Cialis 5 mg daily and finasteride 5 mg daily, dutasteride 0.5 mg daily***  - Cannot tolerate medication or medication failure, schedule cystoscopy  - RTC in *** months for IPSS, PSA, PVR and exam      No Follow-up on file.  These notes generated with voice recognition software. I apologize for typographical errors.  Zara Council, [facility] [address] [place], [place] [address] [phone]

## 2016-02-19 ENCOUNTER — Encounter: Payer: Self-pay | Admitting: Urology

## 2016-02-19 ENCOUNTER — Ambulatory Visit: Payer: BLUE CROSS/BLUE SHIELD | Admitting: Urology

## 2016-02-27 LAB — TESTOSTERONE,FREE AND TOTAL
Testosterone, Free: 4.3 pg/mL — ABNORMAL LOW (ref 7.2–24.0)
Testosterone: 210 ng/dL — ABNORMAL LOW (ref 264–916)

## 2016-02-28 ENCOUNTER — Telehealth: Payer: Self-pay

## 2016-02-28 NOTE — Telephone Encounter (Signed)
/  Androgel Approved

## 2016-03-25 ENCOUNTER — Ambulatory Visit: Payer: BLUE CROSS/BLUE SHIELD | Admitting: Internal Medicine

## 2016-10-15 ENCOUNTER — Encounter: Payer: Self-pay | Admitting: *Deleted

## 2016-10-15 ENCOUNTER — Ambulatory Visit
Admission: EM | Admit: 2016-10-15 | Discharge: 2016-10-15 | Disposition: A | Discharge status: Home / Self Care | Payer: BLUE CROSS/BLUE SHIELD | Attending: Family Medicine | Admitting: Family Medicine

## 2016-10-15 DIAGNOSIS — J0100 Acute maxillary sinusitis, unspecified: Secondary | ICD-10-CM | POA: Diagnosis not present

## 2016-10-15 DIAGNOSIS — R05 Cough: Secondary | ICD-10-CM | POA: Diagnosis not present

## 2016-10-15 DIAGNOSIS — B9789 Other viral agents as the cause of diseases classified elsewhere: Secondary | ICD-10-CM

## 2016-10-15 DIAGNOSIS — J069 Acute upper respiratory infection, unspecified: Secondary | ICD-10-CM

## 2016-10-15 MED ORDER — ALBUTEROL SULFATE HFA 108 (90 BASE) MCG/ACT IN AERS: 2.0000 | INHALATION_SPRAY | Freq: Four times a day (QID) | RESPIRATORY_TRACT | 0 refills | Status: AC | PRN

## 2016-10-15 MED ORDER — DOXYCYCLINE HYCLATE 100 MG PO CAPS: 100.0000 mg | ORAL_CAPSULE | Freq: Two times a day (BID) | ORAL | 0 refills | Status: AC

## 2016-10-15 MED ORDER — IPRATROPIUM-ALBUTEROL 0.5-2.5 (3) MG/3ML IN SOLN
3.0000 mL | Freq: Once | RESPIRATORY_TRACT | Status: AC
Start: 2016-10-15 — End: 2016-10-15
  Administered 2016-10-15: 3 mL via RESPIRATORY_TRACT

## 2016-10-15 MED ORDER — BENZONATATE 100 MG PO CAPS: 100.0000 mg | ORAL_CAPSULE | Freq: Three times a day (TID) | ORAL | 0 refills | Status: AC | PRN

## 2016-10-15 MED ORDER — PREDNISONE 20 MG PO TABS: 40.0000 mg | ORAL_TABLET | Freq: Every day | ORAL | 0 refills | Status: AC

## 2016-10-15 NOTE — ED Triage Notes (Signed)
URI type symptoms over past week which has progressed to productive cough- yellow.

## 2016-10-15 NOTE — Discharge Instructions (Signed)
Take medication as prescribed. Rest. Drink plenty of fluids.  ° °Follow up with your primary care physician this week as needed. Return to Urgent care for new or worsening concerns.  ° °
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
## 2021-12-24 ENCOUNTER — Inpatient Hospital Stay
Admit: 2021-12-24 | Discharge: 2021-12-24 | Disposition: A | Discharge status: Home / Self Care | Attending: Emergency Medicine

## 2021-12-24 ENCOUNTER — Emergency Department: Admit: 2021-12-24 | Primary: Diagnostic Radiology

## 2021-12-24 DIAGNOSIS — R1114 Bilious vomiting: Secondary | ICD-10-CM

## 2021-12-24 LAB — COMPREHENSIVE METABOLIC PANEL
ALT: 20 U/L (ref 0–50)
AST: 23 U/L (ref 0–50)
Albumin/Globulin Ratio: 1.3 (ref 1.00–2.70)
Albumin: 4.3 g/dL (ref 3.5–5.2)
Alk Phosphatase: 78 U/L (ref 40–130)
Anion Gap: 11 mmol/L (ref 2–17)
BUN: 15 mg/dL (ref 8–23)
CALCIUM,CORRECTED,CCA: 9 mg/dL (ref 8.8–10.2)
CO2: 25 mmol/L (ref 22–29)
Calcium: 9.2 mg/dL (ref 8.8–10.2)
Chloride: 101 mmol/L (ref 98–107)
Creatinine: 0.6 mg/dL — ABNORMAL LOW (ref 0.7–1.3)
Est, Glom Filt Rate: 107 mL/min/1.73m (ref >=60)
Globulin: 3.4 g/dL (ref 1.9–4.4)
Glucose: 103 mg/dL — ABNORMAL HIGH (ref 70–99)
OSMOLALITY CALCULATED: 275 mOsm/kg (ref 270–287)
Potassium: 4 mmol/L (ref 3.5–5.3)
Sodium: 137 mmol/L (ref 135–145)
Total Bilirubin: 0.45 mg/dL (ref 0.00–1.20)
Total Protein: 7.7 g/dL (ref 6.4–8.3)

## 2021-12-24 LAB — CBC WITH AUTO DIFFERENTIAL
Absolute Baso #: 0.1 10*3/uL (ref 0.0–0.2)
Absolute Eos #: 0.3 10*3/uL (ref 0.0–0.5)
Absolute Lymph #: 1.6 10*3/uL (ref 1.0–3.2)
Absolute Mono #: 0.4 10*3/uL (ref 0.3–1.0)
Basophils %: 0.6 % (ref 0.0–2.0)
Eosinophils %: 4.4 % (ref 0.0–7.0)
Hematocrit: 40 % (ref 38.0–52.0)
Hemoglobin: 14.2 g/dL (ref 13.0–17.3)
Immature Grans (Abs): 0.03 10*3/uL (ref 0.00–0.06)
Immature Granulocytes: 0.4 % (ref 0.0–0.6)
Lymphocytes: 21 % (ref 15.0–45.0)
MCH: 31.6 pg (ref 27.0–34.5)
MCHC: 35.5 g/dL (ref 32.0–36.0)
MCV: 88.9 fL (ref 84.0–100.0)
MPV: 9.2 fL (ref 7.2–13.2)
Monocytes: 4.9 % (ref 4.0–12.0)
NRBC Absolute: 0 10*3/uL (ref 0.000–0.012)
NRBC Automated: 0 % (ref 0.0–0.2)
Neutrophils %: 68.7 % (ref 42.0–74.0)
Neutrophils Absolute: 5.3 10*3/uL (ref 1.6–7.3)
Platelets: 192 10*3/uL (ref 140–440)
RBC: 4.5 x10e6/mcL (ref 4.00–5.60)
RDW: 12.4 % (ref 11.0–16.0)
WBC: 7.7 10*3/uL (ref 3.8–10.6)

## 2021-12-24 LAB — EKG 12-LEAD
P Axis: 50 degrees
P-R Interval: 148 ms
Q-T Interval: 432 ms
QRS Duration: 128 ms
QTc Calculation (Bazett): 437 ms
R Axis: 44 degrees
T Axis: 57 degrees
Ventricular Rate: 61 {beats}/min

## 2021-12-24 LAB — COVID-19 & INFLUENZA COMBO (LIAT HOSPITAL)
INFLUENZA A: NOT DETECTED
INFLUENZA B: NOT DETECTED
SARS-CoV-2: NOT DETECTED

## 2021-12-24 LAB — TROPONIN: Troponin T: <0.01 ng/mL (ref 0.000–0.010)

## 2021-12-24 LAB — LACTIC ACID: Lactic Acid: 1 mmol/L (ref 0.5–2.0)

## 2021-12-24 MED ORDER — ACETAMINOPHEN 500 MG PO TABS
500 MG | ORAL | Status: AC
Start: 2021-12-24 — End: 2021-12-24
  Administered 2021-12-24: 17:00:00 1000 mg via ORAL

## 2021-12-24 MED ORDER — ONDANSETRON HCL 4 MG PO TABS
4 MG | ORAL_TABLET | Freq: Four times a day (QID) | ORAL | 0 refills | Status: AC | PRN
Start: 2021-12-24 — End: ?

## 2021-12-24 MED ORDER — ONDANSETRON HCL 4 MG/2ML IJ SOLN
4 MG/2ML | Freq: Once | INTRAMUSCULAR | Status: AC
Start: 2021-12-24 — End: 2021-12-24
  Administered 2021-12-24: 16:00:00 4 mg via INTRAVENOUS

## 2021-12-24 MED ORDER — SODIUM CHLORIDE 0.9 % IV BOLUS
0.9 % | Freq: Once | INTRAVENOUS | Status: AC
Start: 2021-12-24 — End: 2021-12-24
  Administered 2021-12-24: 16:00:00 1000 mL via INTRAVENOUS

## 2021-12-24 MED ORDER — KETOROLAC TROMETHAMINE 15 MG/ML IJ SOLN
15 MG/ML | Freq: Once | INTRAMUSCULAR | Status: AC
Start: 2021-12-24 — End: 2021-12-24
  Administered 2021-12-24: 17:00:00 15 mg via INTRAVENOUS

## 2021-12-24 MED FILL — ONDANSETRON HCL 4 MG/2ML IJ SOLN: 4 MG/2ML | INTRAMUSCULAR | Qty: 2

## 2021-12-24 MED FILL — KETOROLAC TROMETHAMINE 15 MG/ML IJ SOLN: 15 MG/ML | INTRAMUSCULAR | Qty: 1

## 2021-12-24 MED FILL — ACETAMINOPHEN EXTRA STRENGTH 500 MG PO TABS: 500 MG | ORAL | Qty: 2

## 2021-12-24 NOTE — ED Provider Notes (Signed)
[facility] EMERGENCY DEPT  EMERGENCY DEPARTMENT ENCOUNTER      Pt Name: Cameron Henderson  MRN: [id number]  Birthdate [birth year]  Date of evaluation: 12/24/2021  Provider: Caroleen Hammanobert Clive Towanda Hornstein, MD  Provider evaluation time: 12/24/21 1128    CHIEF COMPLAINT       Chief Complaint   Patient presents with    Chest Pain     Intermittent chest pain for a week         HISTORY OF PRESENT ILLNESS    66 year old male presents with multiple symptoms including chest discomfort.  He started a sick about 5 days ago with dull headache, aching to his feet bilaterally, nausea,  vomiting, body aches.  Spouse with similar but milder and now resolved symptoms.  History of asthma, arthritis, obstructive sleep apnea, obesity.  Triage vital signs normal.    The history is provided by the patient, the spouse and medical records.     Nursing Notes were reviewed.    REVIEW OF SYSTEMS     Review of Systems   Constitutional:  Positive for activity change, appetite change and fatigue. Negative for fever.   Respiratory:  Positive for cough. Negative for shortness of breath.    Cardiovascular:  Positive for chest pain. Negative for palpitations.   Gastrointestinal:  Positive for nausea and vomiting. Negative for abdominal pain.   Genitourinary:  Negative for dysuria.   Musculoskeletal:  Positive for arthralgias and myalgias. Negative for back pain.   Skin:  Negative for rash.   Neurological:  Positive for headaches. Negative for dizziness and weakness.   All other systems reviewed and are negative.    Except as noted above the remainder of the review of systems was reviewed and negative.     PAST MEDICAL HISTORY   No past medical history on file.    SURGICAL HISTORY     No past surgical history on file.    CURRENT MEDICATIONS       Previous Medications    No medications on file       ALLERGIES     Patient has no known allergies.    FAMILY HISTORY     No family history on file.     SOCIAL HISTORY       Social History     Socioeconomic History    Marital status:  Married       SCREENINGS         Glasgow Coma Scale  Eye Opening: Spontaneous  Best Verbal Response: Oriented  Best Motor Response: Obeys commands  Glasgow Coma Scale Score: 15                     CIWA Assessment  BP: 131/83  Pulse: 62                 PHYSICAL EXAM       ED Triage Vitals [12/24/21 1134]   BP Temp Temp src Pulse Respirations SpO2 Height Weight - Scale   (!) 174/97 98.5 F (36.9 C) -- 64 20 99 % 5\' 8"  (1.727 m) 300 lb (136.1 kg)       Physical Exam  Vitals and nursing note reviewed.   Constitutional:       Appearance: Normal appearance. He is obese. He is not ill-appearing or toxic-appearing.   HENT:      Head: Normocephalic.   Eyes:      Extraocular Movements: Extraocular movements intact.   Cardiovascular:      Rate  and Rhythm: Normal rate.      Pulses: Normal pulses.   Pulmonary:      Effort: Pulmonary effort is normal.   Abdominal:      Palpations: Abdomen is soft.      Tenderness: There is no abdominal tenderness. There is no guarding or rebound.   Musculoskeletal:         General: Normal range of motion.   Skin:     General: Skin is warm and dry.   Neurological:      General: No focal deficit present.      Mental Status: He is alert and oriented to person, place, and time. Mental status is at baseline.   Psychiatric:         Mood and Affect: Mood normal.         Thought Content: Thought content normal.         Judgment: Judgment normal.       Procedures    DIAGNOSTIC RESULTS     EKG: All EKG's are interpreted by the Emergency Department Physician who either signs or Co-signs this chart in the absence of a cardiologist.    RADIOLOGY:   XR CHEST PORTABLE   Final Result   Mild vascular congestion and edema.          LABS:  Labs Reviewed   COMPREHENSIVE METABOLIC PANEL - Abnormal; Notable for the following components:       Result Value    Glucose 103 (*)     Creatinine 0.6 (*)     All other components within normal limits   COVID-19 & INFLUENZA COMBO [facility])    Narrative:     Is this test  for diagnosis or screening?->Diagnosis of ill patient  Symptomatic for COVID-19 as defined by CDC?->Yes  Date of Symptom Onset->12/20/21  Hospitalized for COVID-19?->No  Admitted to ICU for COVID-19?->No  Pregnant:->No   CBC WITH AUTO DIFFERENTIAL   LACTIC ACID   TROPONIN       All other labs were within normal range or not returned as of this dictation.    EMERGENCY DEPARTMENT COURSE/REASSESSMENT and MDM:     ED Course as of 12/24/21 1417   Tue Dec 24, 2021   1152 EKG interpreted by me.  Sinus rhythm at a rate of 61 with sinus arrhythmia.  Mild right ventricular hypertrophy.  Normal T waves.  No ST changes.  No prior available for comparison. [RG]   1349 XR CHEST PORTABLE  Chest x-ray interpreted by me.  Mild vascular congestion noted.  Patient is not hypoxic. [RG]      ED Course User Index  [RG] Caroleen Hamman, MD       MDM  Number of Diagnoses or Management Options  Acute viral syndrome  Bilious vomiting with nausea  Diagnosis management comments: 66 year old male presents with 5 days of viral leg syndrome.  No evidence to suggest pneumonia, acute coronary syndrome.  Vital signs are normal.  Chest x-ray noted.  Work-up discussed with patient and spouse.    Possible false negative COVID test.  Reevaluation at 2 PM.  Patient comfortable.  Received IV fluid and Zofran.  Will discharge home prescription Zofran.  Strongly recommended and obtaining a primary care physician.  He also will return if he feels worse.       Amount and/or Complexity of Data Reviewed  Clinical lab tests: ordered  Tests in the radiology section of CPT: ordered    Risk of Complications, Morbidity, and/or  Mortality  Presenting problems: high  Diagnostic procedures: high  Management options: high    Patient Progress  Patient progress: stable      FINAL IMPRESSION      1. Bilious vomiting with nausea    2. Acute viral syndrome          DISPOSITION/PLAN   DISPOSITION Decision To Discharge 12/24/2021 02:10:36 PM      PATIENT REFERRED  TO:  Locate a Doc  Call [phone] [facility] [phone])  Schedule an appointment as soon as possible for a visit         DISCHARGE MEDICATIONS:  New Prescriptions    ONDANSETRON (ZOFRAN) 4 MG TABLET    Take 1 tablet by mouth every 6 hours as needed for Nausea or Vomiting     Controlled Substances Monitoring:     No flowsheet data found.    (Please note that portions of this note were completed with a voice recognition program.  Efforts were made to edit the dictations but occasionally words are mis-transcribed.)    Caroleen Hamman, MD (electronically signed)  Emergency Medicine            Caroleen Hamman, MD  12/24/21 [phone]

## 2021-12-25 NOTE — Telephone Encounter (Signed)
Patient seen in ER on 12/24/21. Made contact with patient and he declined to establish with a [facility] PCP stating he Is setting up with A PCP his mother is seeing. (Non roper)
